# Patient Record
Sex: Female | Born: 1975 | Race: Black or African American | Hispanic: No | Marital: Single | Smoking: Never smoker
Health system: Southern US, Community
[De-identification: ages and names within clinical notes are randomized; demographics above are authoritative.]

## PROBLEM LIST (undated history)

## (undated) DIAGNOSIS — I10 Essential (primary) hypertension: Secondary | ICD-10-CM

## (undated) HISTORY — PX: ABDOMINAL HYSTERECTOMY: SHX81

## (undated) HISTORY — PX: BACK SURGERY: SHX140

---

## 2018-09-14 ENCOUNTER — Encounter (HOSPITAL_COMMUNITY): Payer: Self-pay

## 2018-09-14 ENCOUNTER — Other Ambulatory Visit: Payer: Self-pay

## 2018-09-14 ENCOUNTER — Emergency Department (HOSPITAL_COMMUNITY)
Admission: EM | Admit: 2018-09-14 | Discharge: 2018-09-14 | Disposition: A | Discharge status: Home / Self Care | Payer: Medicaid Other | Attending: Emergency Medicine | Admitting: Emergency Medicine

## 2018-09-14 DIAGNOSIS — G43909 Migraine, unspecified, not intractable, without status migrainosus: Secondary | ICD-10-CM | POA: Insufficient documentation

## 2018-09-14 DIAGNOSIS — T782XXA Anaphylactic shock, unspecified, initial encounter: Secondary | ICD-10-CM | POA: Insufficient documentation

## 2018-09-14 DIAGNOSIS — I10 Essential (primary) hypertension: Secondary | ICD-10-CM | POA: Diagnosis not present

## 2018-09-14 DIAGNOSIS — T783XXA Angioneurotic edema, initial encounter: Secondary | ICD-10-CM

## 2018-09-14 HISTORY — DX: Essential (primary) hypertension: I10

## 2018-09-14 MED ORDER — ACETAMINOPHEN 500 MG PO TABS
1000.0000 mg | ORAL_TABLET | Freq: Once | ORAL | Status: DC
  Filled 2018-09-14: qty 2

## 2018-09-14 MED ORDER — IBUPROFEN 400 MG PO TABS
600.0000 mg | ORAL_TABLET | Freq: Once | ORAL | Status: DC
  Filled 2018-09-14: qty 1

## 2018-09-14 NOTE — ED Notes (Signed)
Pt left without discharge paperwork.

## 2018-09-14 NOTE — ED Triage Notes (Signed)
Pt presents with headache beginning last night, with swelling beginning on Tuesday.

## 2018-09-14 NOTE — Discharge Instructions (Addendum)
Please return for any problem.  Follow-up with your regular care provider as instructed.  Stop taking lisinopril as instructed to prevent possible angioedema.

## 2018-09-14 NOTE — ED Provider Notes (Signed)
[facility] EMERGENCY DEPARTMENT Provider Note   CSN: [id number] Arrival date & time: 09/14/18  1102     History   Chief Complaint Chief Complaint  Patient presents with  . Migraine    facial swelling    HPI Yesenia Gallegos is a 43 y.o. female.  43 year old female with prior medical history as detailed below presents for evaluation of headache.  Patient reports migraine headache for the last 2 to 3 days.  Patient's migraine is consistent with her normal.  She declines additional work-up for headache today.  She reports that she is feeling better.  She has a child that she has to pick up.  Patient also complains of mild left-sided facial swelling.  This occurred intermittently over the last week.  Her swelling is improved.  She is on lisinopril.  She is not had prior history of angioedema.  She denies associated fever, chest pain, shortness of breath, nausea, vomiting, visual change, or other other complaint.  The history is provided by the patient and medical records.  Migraine  This is a recurrent problem. The current episode started more than 2 days ago. The problem occurs every several days. The problem has been resolved. Pertinent negatives include no chest pain and no abdominal pain. Nothing aggravates the symptoms. Nothing relieves the symptoms. She has tried nothing for the symptoms.    Past Medical History:  Diagnosis Date  . Hypertension     There are no active problems to display for this patient.   Past Surgical History:  Procedure Laterality Date  . ABDOMINAL HYSTERECTOMY    . BACK SURGERY       OB History   No obstetric history on file.      Home Medications    Prior to Admission medications   Not on File    Family History History reviewed. No pertinent family history.  Social History Social History   Tobacco Use  . Smoking status: Never Smoker  . Smokeless tobacco: Never Used  Substance Use Topics  . Alcohol use: Not  Currently  . Drug use: Never     Allergies   Patient has no known allergies.   Review of Systems Review of Systems  Cardiovascular: Negative for chest pain.  Gastrointestinal: Negative for abdominal pain.  All other systems reviewed and are negative.    Physical Exam Updated Vital Signs BP (!) 125/91   Pulse 70   Temp 98.6 F (37 C) (Oral)   Resp 20   Ht 5\' 3"  (1.6 m)   Wt 75.8 kg   SpO2 100%   BMI 29.58 kg/m   Physical Exam Vitals signs and nursing note reviewed.  Constitutional:      General: She is not in acute distress.    Appearance: Normal appearance. She is well-developed.  HENT:     Head: Normocephalic and atraumatic.  Eyes:     Conjunctiva/sclera: Conjunctivae normal.     Pupils: Pupils are equal, round, and reactive to light.  Neck:     Musculoskeletal: Normal range of motion and neck supple.  Cardiovascular:     Rate and Rhythm: Normal rate and regular rhythm.     Heart sounds: Normal heart sounds.  Pulmonary:     Effort: Pulmonary effort is normal. No respiratory distress.     Breath sounds: Normal breath sounds.  Abdominal:     General: There is no distension.     Palpations: Abdomen is soft.     Tenderness: There is no  abdominal tenderness.  Musculoskeletal: Normal range of motion.        General: No deformity.  Skin:    General: Skin is warm and dry.  Neurological:     Mental Status: She is alert and oriented to person, place, and time.      ED Treatments / Results  Labs (all labs ordered are listed, but only abnormal results are displayed) Labs Reviewed - No data to display  EKG None  Radiology No results found.  Procedures Procedures (including critical care time)  Medications Ordered in ED Medications  ibuprofen (ADVIL,MOTRIN) tablet 600 mg (has no administration in time range)  acetaminophen (TYLENOL) tablet 1,000 mg (has no administration in time range)     Initial Impression / Assessment and Plan / ED Course  I  have reviewed the triage vital signs and the nursing notes.  Pertinent labs & imaging results that were available during my care of the patient were reviewed by me and considered in my medical decision making (see chart for details).    MDM  Screen complete  Patient is presenting for evaluation of 2 complaints.  Patient reports mild migraine.  Her symptoms have improved significantly since arrival to the ED.  She consents to getting a dose of Tylenol and ibuprofen in the ED.  She otherwise desires discharge.  She declines further work-up.  Patient does report mild left-sided facial edema that has been intermittent over the last week.  Her edema has improved significantly upon my evaluation today.  She does take lisinopril.  It is possible that her lisinopril is associated with the edema as reported.  She is advised to stop the lisinopril and contact her regular doctor for a different blood pressure medication.  Patient desires discharge.  She understands need for close follow-up.  Strict return precautions given and understood.  Final Clinical Impressions(s) / ED Diagnoses   Final diagnoses:  Migraine without status migrainosus, not intractable, unspecified migraine type  Angioedema, initial encounter    ED Discharge Orders    None       Wynetta Fines, MD 09/14/18 1311

## 2018-09-20 ENCOUNTER — Emergency Department (HOSPITAL_COMMUNITY)
Admission: EM | Admit: 2018-09-20 | Discharge: 2018-09-21 | Disposition: A | Discharge status: Home / Self Care | Payer: Medicaid Other | Attending: Emergency Medicine | Admitting: Emergency Medicine

## 2018-09-20 ENCOUNTER — Emergency Department (HOSPITAL_COMMUNITY): Payer: Medicaid Other

## 2018-09-20 DIAGNOSIS — R519 Headache, unspecified: Secondary | ICD-10-CM

## 2018-09-20 DIAGNOSIS — R51 Headache: Secondary | ICD-10-CM | POA: Diagnosis not present

## 2018-09-20 DIAGNOSIS — Z79899 Other long term (current) drug therapy: Secondary | ICD-10-CM | POA: Diagnosis not present

## 2018-09-20 DIAGNOSIS — I10 Essential (primary) hypertension: Secondary | ICD-10-CM | POA: Diagnosis not present

## 2018-09-20 DIAGNOSIS — R079 Chest pain, unspecified: Secondary | ICD-10-CM | POA: Insufficient documentation

## 2018-09-20 MED ORDER — SODIUM CHLORIDE 0.9% FLUSH: 3.0000 mL | Freq: Once | INTRAVENOUS | Status: DC

## 2018-09-20 NOTE — ED Triage Notes (Signed)
Pt reports chest pressure X few days, R sided with radiation into R arm. Also reports migraine, was seen a few days ago for same.

## 2018-09-21 LAB — BASIC METABOLIC PANEL
Anion gap: 7 (ref 5–15)
BUN: 9 mg/dL (ref 6–20)
CHLORIDE: 104 mmol/L (ref 98–111)
CO2: 27 mmol/L (ref 22–32)
Calcium: 9.4 mg/dL (ref 8.9–10.3)
Creatinine, Ser: 0.69 mg/dL (ref 0.44–1.00)
GFR calc Af Amer: >60 mL/min (ref >60)
GFR calc non Af Amer: >60 mL/min (ref >60)
Glucose, Bld: 94 mg/dL (ref 70–99)
Potassium: 4 mmol/L (ref 3.5–5.1)
Sodium: 138 mmol/L (ref 135–145)

## 2018-09-21 LAB — I-STAT TROPONIN, ED: Troponin i, poc: 0 ng/mL (ref 0.00–0.08)

## 2018-09-21 LAB — I-STAT BETA HCG BLOOD, ED (MC, WL, AP ONLY): I-stat hCG, quantitative: <5 m[IU]/mL (ref <5)

## 2018-09-21 LAB — CBC
HEMATOCRIT: 38 % (ref 36.0–46.0)
Hemoglobin: 12.3 g/dL (ref 12.0–15.0)
MCH: 28.5 pg (ref 26.0–34.0)
MCHC: 32.4 g/dL (ref 30.0–36.0)
MCV: 88.2 fL (ref 80.0–100.0)
Platelets: 269 10*3/uL (ref 150–400)
RBC: 4.31 MIL/uL (ref 3.87–5.11)
RDW: 12.6 % (ref 11.5–15.5)
WBC: 6.8 10*3/uL (ref 4.0–10.5)
nRBC: 0 % (ref 0.0–0.2)

## 2018-09-21 MED ORDER — DIPHENHYDRAMINE HCL 50 MG/ML IJ SOLN: 12.5000 mg | Freq: Once | INTRAMUSCULAR | Status: DC

## 2018-09-21 MED ORDER — KETOROLAC TROMETHAMINE 30 MG/ML IJ SOLN: 30.0000 mg | Freq: Once | INTRAMUSCULAR | Status: DC

## 2018-09-21 MED ORDER — PROCHLORPERAZINE EDISYLATE 10 MG/2ML IJ SOLN: 10.0000 mg | Freq: Once | INTRAMUSCULAR | Status: DC

## 2018-09-21 MED ORDER — METOCLOPRAMIDE HCL 5 MG/ML IJ SOLN: 10.0000 mg | INTRAMUSCULAR | Status: DC

## 2018-09-21 NOTE — Discharge Instructions (Signed)
Take tylenol, ibuprofen, or Aleve for management of your headache. Follow up with your primary care doctor.

## 2018-09-21 NOTE — ED Notes (Signed)
PA notified on pt.'s refusal to medications given intravenously.

## 2018-09-21 NOTE — ED Provider Notes (Signed)
[facility] EMERGENCY DEPARTMENT Provider Note   CSN: [id number] Arrival date & time: 09/20/18  2336     History   Chief Complaint Chief Complaint  Patient presents with  . Chest Pain    HPI Yesenia Gallegos is a 43 y.o. female.  43 year old female with a history of hypertension and depression presents to the emergency department for multiple complaints.  She is primarily concerned about some central chest pain described as a pressure which has been present since yesterday.  Pain has been constant and waxing and waning in severity.  Denies worsening with exertion or eating, but feels it is slightly worse at night before bed.  She has noted some radiation of her pain to her right arm.  Denies taking any medications for her symptoms.  She feels as though there is something in her throat when swallowing at the level of her upper sternum, but has had no difficulty eating or drinking.  Also complaining of an occipital headache similar to the headache she experienced 1 week ago on ED presentation.  She has not taken any medications for her headache as she does not feel they help her.  No photophobia, blurry vision, nausea, vomiting, fevers.  No recent head injury or trauma.  She does have a history of migraine headaches, but states this pain is in a different location than her prior migraines.  The history is provided by the patient. No language interpreter was used.  Chest Pain    Past Medical History:  Diagnosis Date  . Hypertension     There are no active problems to display for this patient.   Past Surgical History:  Procedure Laterality Date  . ABDOMINAL HYSTERECTOMY    . BACK SURGERY       OB History   No obstetric history on file.      Home Medications    Prior to Admission medications   Medication Sig Start Date End Date Taking? Authorizing Provider  omeprazole (PRILOSEC) 40 MG capsule Take 40 mg by mouth daily. 09/06/18  Yes [provider]     Family History No family history on file.  Social History Social History   Tobacco Use  . Smoking status: Never Smoker  . Smokeless tobacco: Never Used  Substance Use Topics  . Alcohol use: Not Currently  . Drug use: Never     Allergies   Patient has no known allergies.   Review of Systems Review of Systems  Cardiovascular: Positive for chest pain.  Ten systems reviewed and are negative for acute change, except as noted in the HPI.    Physical Exam Updated Vital Signs BP (!) 123/94   Pulse 74   Temp 98.1 F (36.7 C) (Oral)   Resp (!) 23   Ht 5\' 3"  (1.6 m)   Wt 75.8 kg   SpO2 99%   BMI 29.58 kg/m   Physical Exam Vitals signs and nursing note reviewed.  Constitutional:      General: She is not in acute distress.    Appearance: She is well-developed. She is not diaphoretic.     Comments: Nontoxic appearing and in NAD  HENT:     Head: Normocephalic and atraumatic.     Right Ear: External ear normal.     Left Ear: External ear normal.     Mouth/Throat:     Mouth: Mucous membranes are moist.     Comments: Symmetric rise of the uvula with phonation. No angioedema or tripoding. Eyes:  General: No scleral icterus.    Conjunctiva/sclera: Conjunctivae normal.  Neck:     Musculoskeletal: Normal range of motion.     Comments: No meningismus Cardiovascular:     Rate and Rhythm: Normal rate and regular rhythm.     Pulses: Normal pulses.  Pulmonary:     Effort: Pulmonary effort is normal. No respiratory distress.     Breath sounds: No stridor.     Comments: Respirations even and unlabored Musculoskeletal: Normal range of motion.  Skin:    General: Skin is warm and dry.     Coloration: Skin is not pale.     Findings: No erythema or rash.  Neurological:     General: No focal deficit present.     Mental Status: She is alert and oriented to person, place, and time.     Coordination: Coordination normal.     Comments: GCS 15. Speech is goal oriented. No  cranial nerve deficits appreciated; symmetric eyebrow raise, no facial drooping, tongue midline. Patient has equal grip strength bilaterally with 5/5 strength against resistance in all major muscle groups bilaterally. Sensation to light touch intact. Patient moves extremities without ataxia.   Psychiatric:        Mood and Affect: Affect is flat.        Behavior: Behavior normal.      ED Treatments / Results  Labs (all labs ordered are listed, but only abnormal results are displayed) Labs Reviewed  BASIC METABOLIC PANEL  CBC  I-STAT TROPONIN, ED  I-STAT BETA HCG BLOOD, ED ([facility], [facility], [facility] ONLY)    EKG EKG Interpretation  Date/Time:  Wednesday September 20 2018 23:43:11 EST Ventricular Rate:  94 PR Interval:  156 QRS Duration: 74 QT Interval:  362 QTC Calculation: 452 R Axis:   61 Text Interpretation:  Normal sinus rhythm Normal ECG Confirmed by Kennis Carina [phone]) on 09/21/2018 12:50:56 AM   Radiology Dg Chest 2 View  Result Date: 09/20/2018 CLINICAL DATA:  Chest pain radiating to the right arm EXAM: CHEST - 2 VIEW COMPARISON:  None. FINDINGS: The heart size and mediastinal contours are within normal limits. Both lungs are clear. The visualized skeletal structures are unremarkable. IMPRESSION: No active cardiopulmonary disease. Electronically Signed   By: Elige Ko   On: 09/20/2018 23:59    Procedures Procedures (including critical care time)  Medications Ordered in ED Medications  sodium chloride flush (NS) 0.9 % injection 3 mL (3 mLs Intravenous Not Given 09/21/18 0041)    1:22 AM Patient declines all medications, stating she doesn't "like taking them". States she only came "to make sure my heart was okay".   Initial Impression / Assessment and Plan / ED Course  I have reviewed the triage vital signs and the nursing notes.  Pertinent labs & imaging results that were available during my care of the patient were reviewed by me and considered in my medical decision making  (see chart for details).     Patient presents to the emergency department for evaluation of constant chest pain x 2 days.  Low suspicion for emergent cardiac etiology given reassuring workup today.  EKG is nonischemic and troponin negative.  Her symptoms are atypical of ACS.  Chest x-ray without evidence of mediastinal widening to suggest dissection.  No pneumothorax, pneumonia, pleural effusion.  Pulmonary embolus further considered; however, patient without tachycardia, tachypnea, dyspnea, hypoxia.  Patient is PERC negative.  Question underlying anxiety or stress component to patient's chest pain given presence of globus type sensation and  worsening symptoms at night when no longer preoccupied.  I attempted to manage the patient's headache complaints with IV or IM medications; however, she declines any medications while in the ED.  Thus, I have encouraged that she continue use of Tylenol or ibuprofen.  Patient appropriate for continued follow-up with her primary care doctor.  Return precautions discussed and provided. Patient discharged in stable condition with no unaddressed concerns.   Vitals:   09/20/18 2340 09/21/18 0024 09/21/18 0030 09/21/18 0100  BP: (!) 147/99 (!) 128/101 (!) 132/98 (!) 123/94  Pulse: 94 83 77 74  Resp: 18 18 (!) 25 (!) 23  Temp: 98.1 F (36.7 C)     TempSrc: Oral     SpO2: 99% 100% 100% 99%  Weight:      Height:        Final Clinical Impressions(s) / ED Diagnoses   Final diagnoses:  Nonspecific chest pain  Bad headache    ED Discharge Orders    None       Antony MaduraHumes, Idelia Caudell, PA-C 09/21/18 0200    Sabas SousBero, Michael M, MD 09/21/18 [phone]

## 2020-09-10 ENCOUNTER — Encounter (HOSPITAL_COMMUNITY): Payer: Self-pay | Admitting: Emergency Medicine

## 2020-09-10 ENCOUNTER — Emergency Department (HOSPITAL_COMMUNITY): Payer: Medicaid Other

## 2020-09-10 ENCOUNTER — Emergency Department (HOSPITAL_COMMUNITY)
Admission: EM | Admit: 2020-09-10 | Discharge: 2020-09-10 | Disposition: A | Discharge status: Home / Self Care | Payer: Medicaid Other | Attending: Emergency Medicine | Admitting: Emergency Medicine

## 2020-09-10 DIAGNOSIS — R0789 Other chest pain: Secondary | ICD-10-CM | POA: Insufficient documentation

## 2020-09-10 DIAGNOSIS — I10 Essential (primary) hypertension: Secondary | ICD-10-CM | POA: Insufficient documentation

## 2020-09-10 DIAGNOSIS — R519 Headache, unspecified: Secondary | ICD-10-CM | POA: Diagnosis not present

## 2020-09-10 DIAGNOSIS — Z20822 Contact with and (suspected) exposure to covid-19: Secondary | ICD-10-CM | POA: Diagnosis not present

## 2020-09-10 LAB — CBC
HCT: 39.2 % (ref 36.0–46.0)
Hemoglobin: 13.3 g/dL (ref 12.0–15.0)
MCH: 30 pg (ref 26.0–34.0)
MCHC: 33.9 g/dL (ref 30.0–36.0)
MCV: 88.5 fL (ref 80.0–100.0)
Platelets: 250 10*3/uL (ref 150–400)
RBC: 4.43 MIL/uL (ref 3.87–5.11)
RDW: 12.5 % (ref 11.5–15.5)
WBC: 2.9 10*3/uL — ABNORMAL LOW (ref 4.0–10.5)
nRBC: 0 % (ref 0.0–0.2)

## 2020-09-10 LAB — SARS CORONAVIRUS 2 (TAT 6-24 HRS): SARS Coronavirus 2: NEGATIVE

## 2020-09-10 LAB — BASIC METABOLIC PANEL
Anion gap: 8 (ref 5–15)
BUN: 6 mg/dL (ref 6–20)
CO2: 27 mmol/L (ref 22–32)
Calcium: 9.3 mg/dL (ref 8.9–10.3)
Chloride: 102 mmol/L (ref 98–111)
Creatinine, Ser: 0.93 mg/dL (ref 0.44–1.00)
GFR, Estimated: >60 mL/min (ref >60)
Glucose, Bld: 95 mg/dL (ref 70–99)
Potassium: 3.7 mmol/L (ref 3.5–5.1)
Sodium: 137 mmol/L (ref 135–145)

## 2020-09-10 LAB — TROPONIN I (HIGH SENSITIVITY): Troponin I (High Sensitivity): 2 ng/L (ref <18)

## 2020-09-10 MED ORDER — SODIUM CHLORIDE 0.9 % IV BOLUS: 500.0000 mL | Freq: Once | INTRAVENOUS | Status: DC

## 2020-09-10 MED ORDER — KETOROLAC TROMETHAMINE 30 MG/ML IJ SOLN: 30.0000 mg | Freq: Once | INTRAMUSCULAR | Status: DC

## 2020-09-10 MED ORDER — KETOROLAC TROMETHAMINE 30 MG/ML IJ SOLN
60.0000 mg | Freq: Once | INTRAMUSCULAR | Status: AC
  Administered 2020-09-10: 60 mg via INTRAMUSCULAR
  Filled 2020-09-10: qty 2

## 2020-09-10 NOTE — ED Provider Notes (Signed)
[facility] EMERGENCY DEPARTMENT Provider Note   CSN: [id number] Arrival date & time: 09/10/20  0901     History Chief Complaint  Patient presents with  . Chest Pain    Yesenia Gallegos is a 45 y.o. female.  HPI   45 year old female with past medical history of GERD presents to the emergency department with chest discomfort and headache.  Patient states for the last week she has been feeling very weak.  She has been having left-sided chest discomfort that has been intermittent, nonexertional, self resolves.  Patient has history of migrainous headaches but a week ago developed a diffuse headache that she states is more severe than her previous headaches.  She states the headache is present overnight, sometimes wakes her up or keeps her up, worse in the mornings.  No neck pain or stiffness.  No report of fever/chills.  She denies any shortness of breath or cough at this time.  No swelling of her lower extremities.  She is unvaccinated, was last tested about a week ago and negative.  Past Medical History:  Diagnosis Date  . Hypertension     There are no problems to display for this patient.   Past Surgical History:  Procedure Laterality Date  . ABDOMINAL HYSTERECTOMY    . BACK SURGERY       OB History   No obstetric history on file.     No family history on file.  Social History   Tobacco Use  . Smoking status: Never Smoker  . Smokeless tobacco: Never Used  Vaping Use  . Vaping Use: Never used  Substance Use Topics  . Alcohol use: Not Currently  . Drug use: Never    Home Medications Prior to Admission medications   Medication Sig Start Date End Date Taking? Authorizing Provider  omeprazole (PRILOSEC) 40 MG capsule Take 40 mg by mouth daily. 09/06/18   [provider]    Allergies    Patient has no known allergies.  Review of Systems   Review of Systems  Constitutional: Positive for fatigue. Negative for chills and fever.  HENT:  Negative for congestion.   Eyes: Negative for visual disturbance.  Respiratory: Negative for shortness of breath.   Cardiovascular: Positive for chest pain.  Gastrointestinal: Negative for abdominal pain, diarrhea and vomiting.  Genitourinary: Negative for dysuria.  Skin: Negative for rash.  Neurological: Positive for headaches.    Physical Exam Updated Vital Signs BP 128/90 (BP Location: Left Arm)   Pulse 78   Temp (!) 97.5 F (36.4 C) (Oral)   Resp 18   SpO2 100%   Physical Exam Vitals and nursing note reviewed.  Constitutional:      Appearance: Normal appearance.  HENT:     Head: Normocephalic.     Mouth/Throat:     Mouth: Mucous membranes are moist.  Eyes:     Pupils: Pupils are equal, round, and reactive to light.  Neck:     Comments: No stiffness Cardiovascular:     Rate and Rhythm: Normal rate.  Pulmonary:     Effort: Pulmonary effort is normal. No respiratory distress.     Breath sounds: No decreased breath sounds.  Chest:     Chest wall: No crepitus.  Abdominal:     Palpations: Abdomen is soft.     Tenderness: There is no abdominal tenderness.  Musculoskeletal:     Right lower leg: No edema.     Left lower leg: No edema.  Skin:  General: Skin is warm.  Neurological:     Mental Status: She is alert and oriented to person, place, and time. Mental status is at baseline.  Psychiatric:        Mood and Affect: Mood normal.     ED Results / Procedures / Treatments   Labs (all labs ordered are listed, but only abnormal results are displayed) Labs Reviewed  CBC - Abnormal; Notable for the following components:      Result Value   WBC 2.9 (*)    All other components within normal limits  BASIC METABOLIC PANEL  I-STAT BETA HCG BLOOD, ED ([facility], [facility], [facility] ONLY)  TROPONIN I (HIGH SENSITIVITY)  TROPONIN I (HIGH SENSITIVITY)    EKG None  Radiology DG Chest 2 View  Result Date: 09/10/2020 CLINICAL DATA:  Shortness of breath and cardiac palpitations EXAM:  CHEST - 2 VIEW COMPARISON:  September 20, 2018 FINDINGS: Lungs are clear. Heart size and pulmonary vascularity are normal. No adenopathy. Postoperative change noted in the lumbar spine. There is mid to lower thoracic levoscoliosis. IMPRESSION: Lungs clear.  Cardiac silhouette normal. Electronically Signed   By: Bretta Bang III M.D.   On: 09/10/2020 09:22    Procedures Procedures   Medications Ordered in ED Medications - No data to display  ED Course  I have reviewed the triage vital signs and the nursing notes.  Pertinent labs & imaging results that were available during my care of the patient were reviewed by me and considered in my medical decision making (see chart for details).    MDM Rules/Calculators/A&P                          45 year old female presents the emergency department with a week of weakness and fatigue now associated with intermittent chest pain and persistent diffuse headache.  Vitals are stable on arrival.  She is sitting up, well-appearing, flat affect.  Appears baseline neurologically.  No neck pain or stiffness.  Last Covid test was a week ago and negative.  Initial blood work is reassuring, mild leukopenia, negative troponin.  Chest x-ray shows no acute finding.  Need to evaluate EKG, head CT with the concerning pattern of headache, treat symptomatically.  CT of the head shows no acute finding.  No other red flags in her headache to warrant further imaging.  After Toradol her symptoms have improved.  Chest pain has been going on for a week, atypical, self resolved, currently not present.  She is low heart score, PERC negative.  One troponin will suffice which is negative, no signs of ischemia on her cardiac work-up today.  She is sitting up, comfortable appearing.  Will recheck Covid swab.  Patient will be discharged and treated as an outpatient.  Discharge plan and strict return to ED precautions discussed, patient verbalizes understanding and agreement.  Final  Clinical Impression(s) / ED Diagnoses Final diagnoses:  None    Rx / DC Orders ED Discharge Orders    None       Rozelle Logan, DO 09/10/20 1417

## 2020-09-10 NOTE — ED Notes (Signed)
Unable to obtain blood have 3 sticks

## 2020-09-10 NOTE — Discharge Instructions (Addendum)
You have been seen and discharged from the emergency department.  Your heart work-up was normal. Your head scan was normal. Blood work Follow-up with your primary provider for reevaluation. Take home medications as prescribed. If you have any worsening symptoms or further concerns for health please return to an emergency department for further evaluation.

## 2020-09-10 NOTE — ED Triage Notes (Signed)
Patient complains of chest pain, shortness of breath, and weakness for x7 days. Denies COVID and influenza vaccine. Tested on 09/05/2020 for COVID (PCR) and was negative.

## 2021-06-07 ENCOUNTER — Emergency Department (HOSPITAL_COMMUNITY): Payer: Medicaid Other

## 2021-06-07 ENCOUNTER — Encounter (HOSPITAL_COMMUNITY): Payer: Self-pay | Admitting: Emergency Medicine

## 2021-06-07 ENCOUNTER — Emergency Department (HOSPITAL_COMMUNITY)
Admission: EM | Admit: 2021-06-07 | Discharge: 2021-06-07 | Disposition: A | Discharge status: Home / Self Care | Payer: Medicaid Other | Attending: Emergency Medicine | Admitting: Emergency Medicine

## 2021-06-07 ENCOUNTER — Other Ambulatory Visit: Payer: Self-pay

## 2021-06-07 ENCOUNTER — Emergency Department (HOSPITAL_COMMUNITY)
Admission: EM | Admit: 2021-06-07 | Discharge: 2021-06-07 | Discharge status: Against Medical Advice | Payer: Medicaid Other

## 2021-06-07 DIAGNOSIS — R000 Tachycardia, unspecified: Secondary | ICD-10-CM | POA: Insufficient documentation

## 2021-06-07 DIAGNOSIS — R072 Precordial pain: Secondary | ICD-10-CM | POA: Diagnosis not present

## 2021-06-07 DIAGNOSIS — R0602 Shortness of breath: Secondary | ICD-10-CM | POA: Insufficient documentation

## 2021-06-07 DIAGNOSIS — R42 Dizziness and giddiness: Secondary | ICD-10-CM | POA: Insufficient documentation

## 2021-06-07 DIAGNOSIS — R110 Nausea: Secondary | ICD-10-CM | POA: Insufficient documentation

## 2021-06-07 DIAGNOSIS — Z5321 Procedure and treatment not carried out due to patient leaving prior to being seen by health care provider: Secondary | ICD-10-CM | POA: Diagnosis not present

## 2021-06-07 LAB — LIPASE, BLOOD: Lipase: 46 U/L (ref 11–51)

## 2021-06-07 LAB — CBC WITH DIFFERENTIAL/PLATELET
Abs Immature Granulocytes: 0.01 10*3/uL (ref 0.00–0.07)
Basophils Absolute: 0 10*3/uL (ref 0.0–0.1)
Basophils Relative: 1 %
Eosinophils Absolute: 0.1 10*3/uL (ref 0.0–0.5)
Eosinophils Relative: 1 %
HCT: 38.9 % (ref 36.0–46.0)
Hemoglobin: 13 g/dL (ref 12.0–15.0)
Immature Granulocytes: 0 %
Lymphocytes Relative: 45 %
Lymphs Abs: 2.8 10*3/uL (ref 0.7–4.0)
MCH: 29.3 pg (ref 26.0–34.0)
MCHC: 33.4 g/dL (ref 30.0–36.0)
MCV: 87.6 fL (ref 80.0–100.0)
Monocytes Absolute: 0.4 10*3/uL (ref 0.1–1.0)
Monocytes Relative: 7 %
Neutro Abs: 2.9 10*3/uL (ref 1.7–7.7)
Neutrophils Relative %: 46 %
Platelets: 237 10*3/uL (ref 150–400)
RBC: 4.44 MIL/uL (ref 3.87–5.11)
RDW: 11.8 % (ref 11.5–15.5)
WBC: 6.2 10*3/uL (ref 4.0–10.5)
nRBC: 0 % (ref 0.0–0.2)

## 2021-06-07 LAB — COMPREHENSIVE METABOLIC PANEL
ALT: 22 U/L (ref 0–44)
AST: 20 U/L (ref 15–41)
Albumin: 4.3 g/dL (ref 3.5–5.0)
Alkaline Phosphatase: 55 U/L (ref 38–126)
Anion gap: 10 (ref 5–15)
BUN: 11 mg/dL (ref 6–20)
CO2: 25 mmol/L (ref 22–32)
Calcium: 9.3 mg/dL (ref 8.9–10.3)
Chloride: 103 mmol/L (ref 98–111)
Creatinine, Ser: 0.67 mg/dL (ref 0.44–1.00)
GFR, Estimated: >60 mL/min (ref >60)
Glucose, Bld: 91 mg/dL (ref 70–99)
Potassium: 3.6 mmol/L (ref 3.5–5.1)
Sodium: 138 mmol/L (ref 135–145)
Total Bilirubin: 0.6 mg/dL (ref 0.3–1.2)
Total Protein: 8.1 g/dL (ref 6.5–8.1)

## 2021-06-07 LAB — TROPONIN I (HIGH SENSITIVITY): Troponin I (High Sensitivity): 2 ng/L (ref <18)

## 2021-06-07 LAB — I-STAT BETA HCG BLOOD, ED (MC, WL, AP ONLY): I-stat hCG, quantitative: <5 m[IU]/mL (ref <5)

## 2021-06-07 NOTE — ED Notes (Signed)
Pt approached staff stating that she was "leaving that she was too tired to continue to wait".  Pt stated she was feeling better.

## 2021-06-07 NOTE — ED Triage Notes (Signed)
Pt c/o substernal chest pressure and heaviness 8/10 on the pain scale.  C/O dizziness and when it started SOB however that has resolved.

## 2021-06-07 NOTE — ED Provider Notes (Signed)
Emergency Medicine Provider Triage Evaluation Note  Yesenia Gallegos , a 45 y.o. female  was evaluated in triage.  Pt complains of chest pain.  Chest pain has been constant since she had it yesterday.  Pain is substernal described as heaviness.  No aggravating or alleviating factors.  Patient endorsed associated shortness of breath, dizziness, and nausea.  Patient also reports that this morning she had tingling sensation to the entire left side of her body which has since resolved.  Patient also endorses generalized myalgias.  Review of Systems  Positive: Chest pain, shortness of breath, dizziness, nausea, tingling sensation, generalized myalgia Negative: Fever, chills, rhinorrhea, sore throat, nasal congestion, cough  Physical Exam  BP (!) 137/109 (BP Location: Left Arm)   Pulse (!) 104   Temp 98.1 F (36.7 C)   Resp 16   SpO2 100%  Gen:   Awake, no distress   Resp:  Normal effort  MSK:   Moves extremities without difficulty; no swelling or tenderness to bilateral lower extremities. Other:  +5 strength to bilateral upper and lower extremities.  Sensation to light touch intact to bilateral upper and lower extremities.  Pronator drift negative.  CN II through XII intact.  Medical Decision Making  Medically screening exam initiated at 9:36 PM.  Appropriate orders placed.  Yesenia Gallegos was informed that the remainder of the evaluation will be completed by another provider, this initial triage assessment does not replace that evaluation, and the importance of remaining in the ED until their evaluation is complete.  ACS work-up initiated.  Due to patient's tachycardia, chest pain, and reports of shortness of breath will obtain dimer to evaluate for PE.   Yesenia Gallegos 06/07/21 2138    Yesenia Hong, MD 06/08/21 2034

## 2023-01-11 IMAGING — DX DG CHEST 2V
2 series · 2 of 2 positions shown · non-contrast
Comparison: 09/10/2020

CLINICAL DATA: Chest pain and shortness of breath.

EXAM:
CHEST - 2 VIEW

[chest pa]
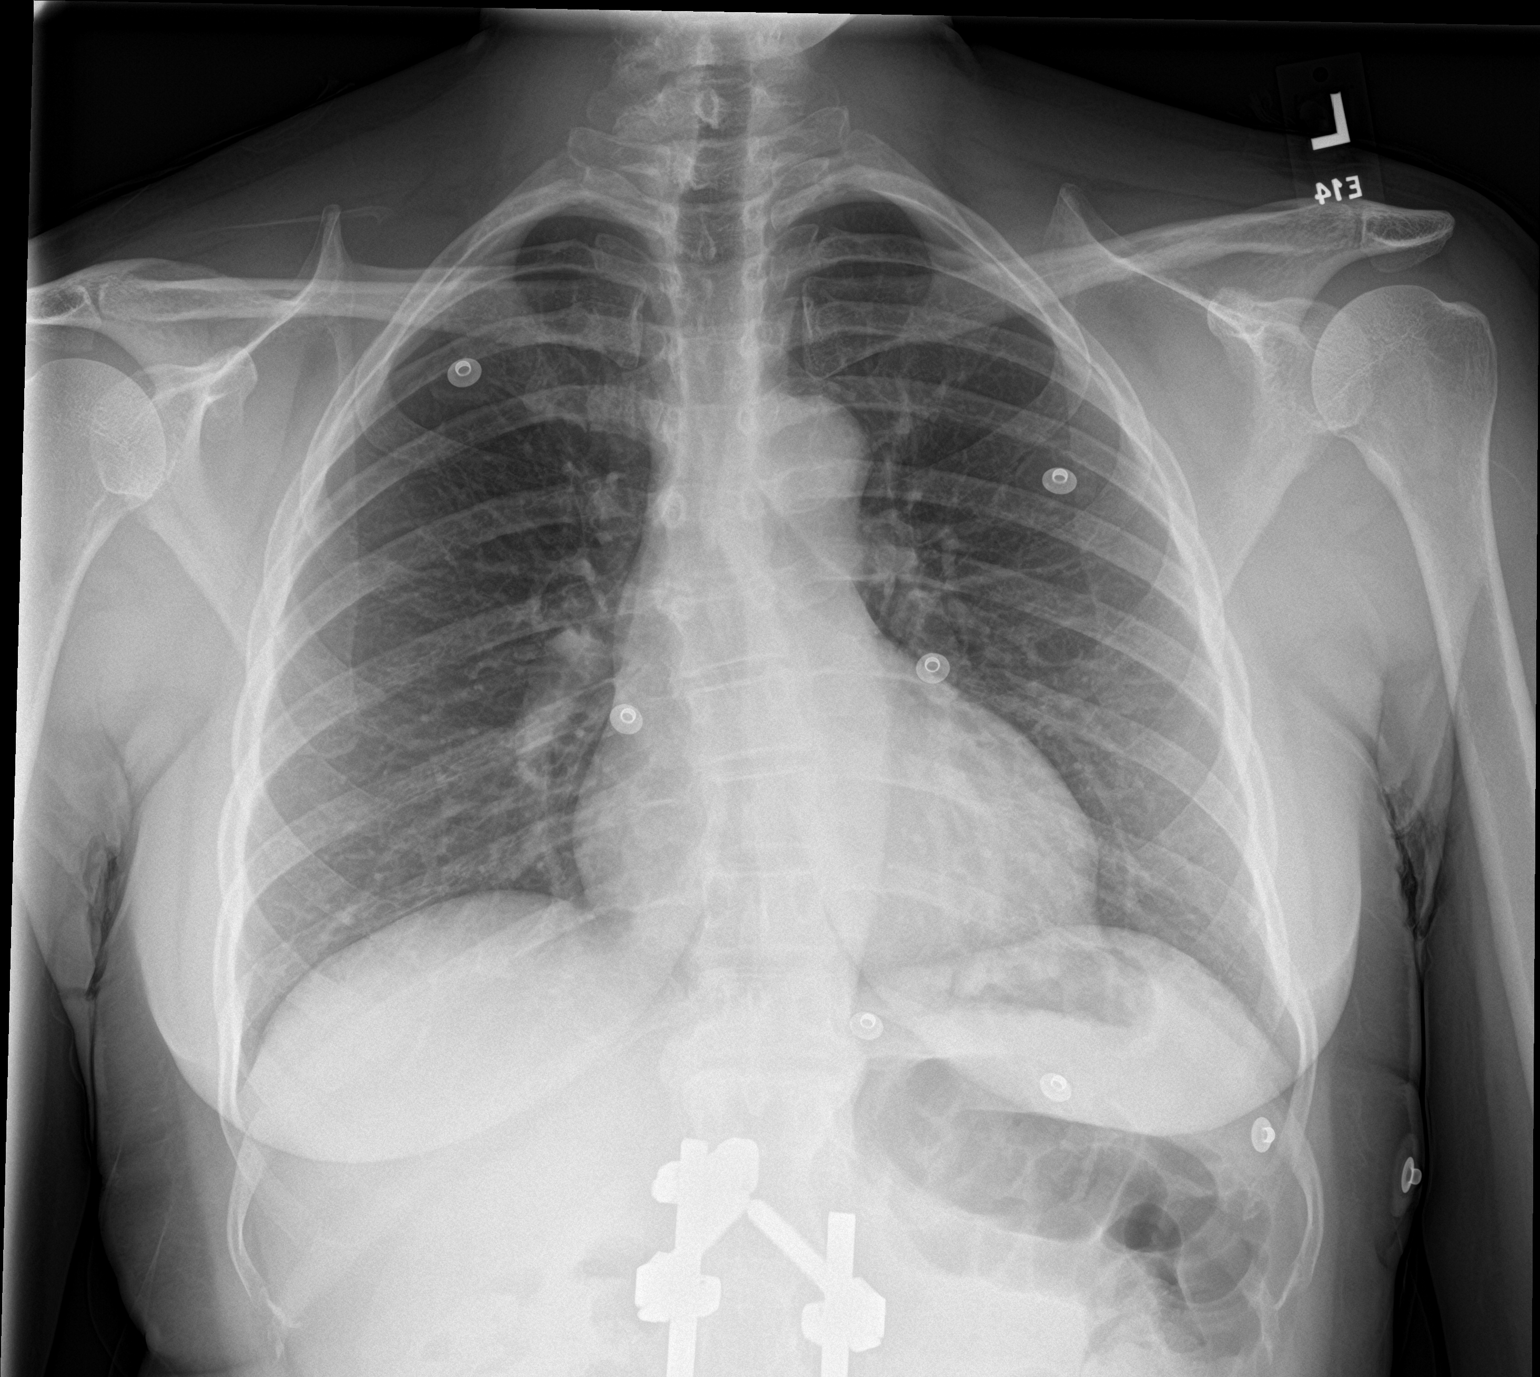

[chest lat]
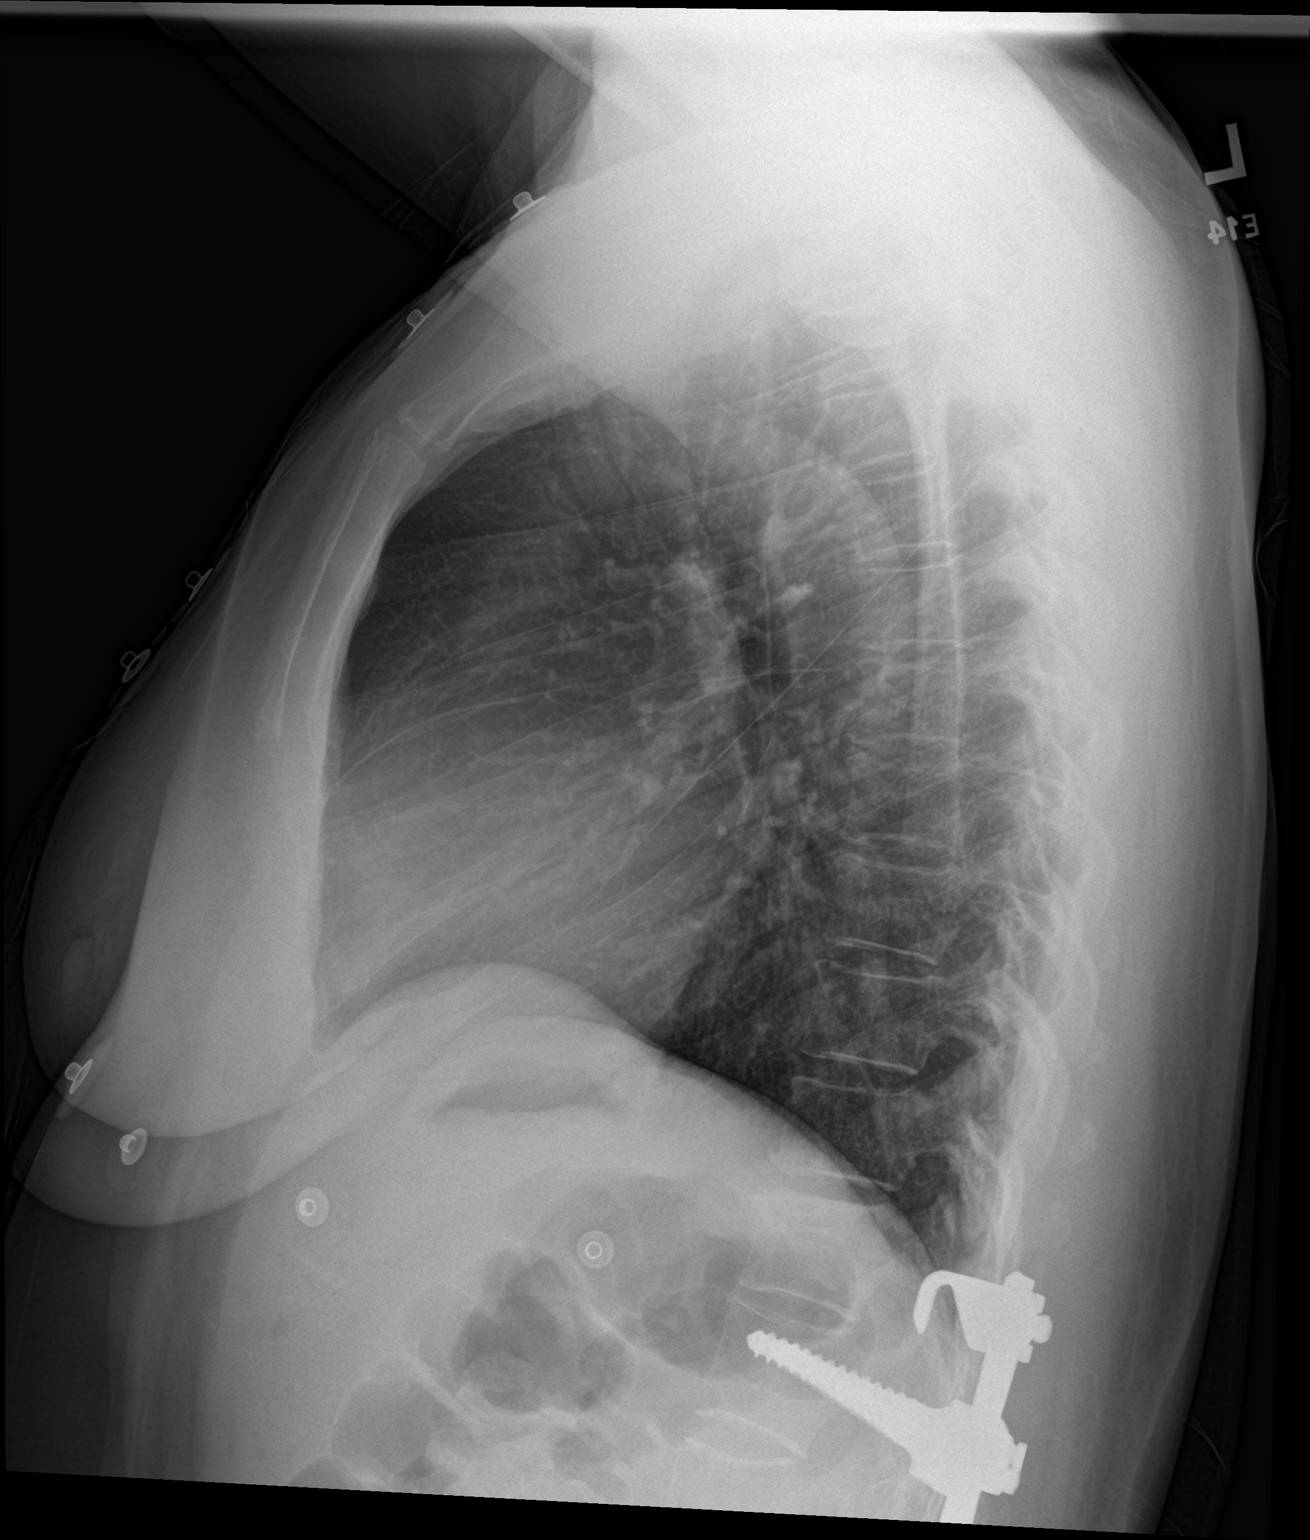

[2 of 2 positions shown; findings below may reference images not displayed]

FINDINGS: The cardiomediastinal contours are normal. The lungs are clear.
Pulmonary vasculature is normal. No consolidation, pleural effusion,
or pneumothorax. No acute osseous abnormalities are seen. Lumbar
fusion hardware is partially included.
IMPRESSION: No acute chest findings.
